# Patient Record
Sex: Female | Born: 1995
Health system: Southern US, Community
[De-identification: ages and names within clinical notes are randomized; demographics above are authoritative.]

## PROBLEM LIST (undated history)

## (undated) DIAGNOSIS — R519 Headache, unspecified: Secondary | ICD-10-CM

## (undated) DIAGNOSIS — R51 Headache: Secondary | ICD-10-CM

---
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
## 2017-08-02 DIAGNOSIS — Z30017 Encounter for initial prescription of implantable subdermal contraceptive: Secondary | ICD-10-CM | POA: Diagnosis not present

## 2017-09-12 ENCOUNTER — Encounter (HOSPITAL_COMMUNITY): Payer: Self-pay | Admitting: Emergency Medicine

## 2017-09-12 DIAGNOSIS — Z79899 Other long term (current) drug therapy: Secondary | ICD-10-CM | POA: Insufficient documentation

## 2017-09-12 DIAGNOSIS — R51 Headache: Secondary | ICD-10-CM | POA: Diagnosis present

## 2017-09-12 DIAGNOSIS — G43909 Migraine, unspecified, not intractable, without status migrainosus: Secondary | ICD-10-CM | POA: Insufficient documentation

## 2017-09-12 NOTE — ED Triage Notes (Signed)
Pt reports migraine x2 states, hx of same and they have been getting worse recently. States hasn't been able to sleep for two days d/t pain. Excedrin and ibuprofen not effective for pain.

## 2017-09-13 ENCOUNTER — Emergency Department (HOSPITAL_COMMUNITY)
Admission: EM | Admit: 2017-09-13 | Discharge: 2017-09-13 | Disposition: A | Discharge status: Home / Self Care | Payer: BLUE CROSS/BLUE SHIELD | Attending: Emergency Medicine | Admitting: Emergency Medicine

## 2017-09-13 DIAGNOSIS — G43909 Migraine, unspecified, not intractable, without status migrainosus: Secondary | ICD-10-CM

## 2017-09-13 HISTORY — DX: Headache: R51

## 2017-09-13 HISTORY — DX: Headache, unspecified: R51.9

## 2017-09-13 MED ORDER — KETOROLAC TROMETHAMINE 30 MG/ML IJ SOLN
30.0000 mg | Freq: Once | INTRAMUSCULAR | Status: AC
  Administered 2017-09-13: 30 mg via INTRAVENOUS
  Filled 2017-09-13: qty 1

## 2017-09-13 MED ORDER — METOCLOPRAMIDE HCL 5 MG/ML IJ SOLN
10.0000 mg | INTRAMUSCULAR | Status: AC
  Administered 2017-09-13: 10 mg via INTRAVENOUS
  Filled 2017-09-13: qty 2

## 2017-09-13 MED ORDER — SODIUM CHLORIDE 0.9 % IV BOLUS (SEPSIS)
1000.0000 mL | Freq: Once | INTRAVENOUS | Status: AC
  Administered 2017-09-13: 1000 mL via INTRAVENOUS

## 2017-09-13 NOTE — ED Notes (Signed)
PA at bedside.

## 2017-09-13 NOTE — ED Notes (Signed)
ED Provider at bedside. 

## 2017-09-13 NOTE — ED Notes (Signed)
Pt verbalizes understanding of d/c instructions. Pt ambulatory at d/c with all belongings.   

## 2017-09-13 NOTE — ED Provider Notes (Signed)
[facility]-EMERGENCY DEPT Provider Note   CSN: [id number] Arrival date & time: 09/12/17  2003     History   Chief Complaint Chief Complaint  Patient presents with  . Migraine    HPI Michelle Collins is a 21 y.o. female.  21 year old female presents to the emergency department for evaluation of a migraine headache which has been constant over the past day. She reports similar headaches intermittently over the past week. Headaches preceded by an aura characterized as spots in her vision. She notes anorexia, nausea, and photophobia with her migraine headaches. She has tried Excedrin Migraine and ibuprofen for pain without relief. She denies fever, head trauma, vomiting, extremity numbness or paresthesias, or extremity weakness. She has not seen a specialist for her headaches, but states that she has had similar migraines in the past.   The history is provided by the patient. No language interpreter was used.  Migraine     Past Medical History:  Diagnosis Date  . Frequent headaches     There are no active problems to display for this patient.   History reviewed. No pertinent surgical history.  OB History    No data available       Home Medications    Prior to Admission medications   Medication Sig Start Date End Date Taking? Authorizing Provider  aspirin-acetaminophen-caffeine (EXCEDRIN MIGRAINE) [phone] MG tablet Take 1 tablet by mouth every 6 (six) hours as needed for headache.   Yes [provider]  docusate sodium (COLACE) 100 MG capsule Take 1 capsule (100 mg total) by mouth every 12 (twelve) hours. Patient not taking: Reported on 09/13/2017 11/16/15   Lyndal Pulley, MD  polyethylene glycol powder (MIRALAX) powder TAKE 6 CAPFULS OF MIRALAX IN A 32 OUNCE GATORADE AND DRINK THE WHOLE BEVERAGE FOLLOWED BY 3 CAPFULS TWICE A DAY FOR THE NEXT WEEK AND FOLLOW UP WITH YOUR PRIMARY CARE PHYSICIAN. Patient not taking: Reported on 09/13/2017 11/16/15   Lyndal Pulley, MD     Family History Family History  Problem Relation Age of Onset  . Hypertension Other     Social History Social History  Substance Use Topics  . Smoking status: Never Smoker  . Smokeless tobacco: Never Used  . Alcohol use No     Allergies   Cinnamon and Coconut flavor   Review of Systems Review of Systems Ten systems reviewed and are negative for acute change, except as noted in the HPI.    Physical Exam Updated Vital Signs BP 123/87   Pulse 92   Temp 98.8 F (37.1 C) (Oral)   Resp 16   SpO2 100%   Physical Exam  Constitutional: She is oriented to person, place, and time. She appears well-developed and well-nourished. No distress.  Nontoxic and in NAD  HENT:  Head: Normocephalic and atraumatic.  Mouth/Throat: Oropharynx is clear and moist.  Symmetric rise of the uvula with phonation.  Eyes: Pupils are equal, round, and reactive to light. Conjunctivae and EOM are normal. No scleral icterus.  Neck: Normal range of motion.  No meningismus  Cardiovascular: Normal rate, regular rhythm and intact distal pulses.   Pulmonary/Chest: Effort normal. No respiratory distress. She has no wheezes.  Respirations even and unlabored  Musculoskeletal: Normal range of motion.  Neurological: She is alert and oriented to person, place, and time. No cranial nerve deficit. She exhibits normal muscle tone. Coordination normal.  GCS 15. Speech is goal oriented. No cranial nerve deficits appreciated; symmetric eyebrow raise, no facial drooping, tongue midline. Patient has  equal grip strength bilaterally with 5/5 strength against resistance in all major muscle groups bilaterally. Sensation to light touch intact. Patient moves extremities without ataxia. Patient ambulatory with steady gait.  Skin: Skin is warm and dry. No rash noted. She is not diaphoretic. No erythema. No pallor.  Psychiatric: She has a normal mood and affect. Her behavior is normal.  Nursing note and vitals  reviewed.    ED Treatments / Results  Labs (all labs ordered are listed, but only abnormal results are displayed) Labs Reviewed - No data to display  EKG  EKG Interpretation None       Radiology No results found.  Procedures Procedures (including critical care time)  Medications Ordered in ED Medications  sodium chloride 0.9 % bolus 1,000 mL (1,000 mLs Intravenous New Bag/Given 09/13/17 0234)  ketorolac (TORADOL) 30 MG/ML injection 30 mg (30 mg Intravenous Given 09/13/17 0238)  metoCLOPramide (REGLAN) injection 10 mg (10 mg Intravenous Given 09/13/17 0239)     Initial Impression / Assessment and Plan / ED Course  I have reviewed the triage vital signs and the nursing notes.  Pertinent labs & imaging results that were available during my care of the patient were reviewed by me and considered in my medical decision making (see chart for details).     21 year old female with history of migraine headaches presents to the emergency department for a headache consistent with prior migraines. She denies any recent head injury or trauma. She is afebrile and without nuchal rigidity or meningismus. Neurologic exam nonfocal, reassuring.  Patient managed in the emergency department with a migraine cocktail. She has had resolution of her headache following this regimen. Low suspicion for emergent intracranial process. Have advised outpatient primary care follow-up. Return precautions discussed and provided. Patient discharged in stable condition with no unaddressed concerns.   Final Clinical Impressions(s) / ED Diagnoses   Final diagnoses:  Migraine without status migrainosus, not intractable, unspecified migraine type    New Prescriptions New Prescriptions   No medications on file     Antony Madura, PA-C 09/13/17 1610    Ward, Layla Maw, DO 09/13/17 [phone]

## 2017-09-13 NOTE — Discharge Instructions (Signed)
Take Excedrin migraine as needed for persistent symptoms. Avoid chocolate and excessive caffeine. Get plenty of sleep. Follow up with a primary care doctor.

## 2017-09-13 NOTE — ED Notes (Signed)
Pt called out.  Stating she is concerned because she is starting to feel sleepy and she needs to drive.  Will inform PA.

## 2017-10-12 DIAGNOSIS — G43109 Migraine with aura, not intractable, without status migrainosus: Secondary | ICD-10-CM | POA: Diagnosis not present

## 2017-12-09 ENCOUNTER — Emergency Department (HOSPITAL_COMMUNITY)
Admission: EM | Admit: 2017-12-09 | Discharge: 2017-12-09 | Discharge status: Absent without leave | Payer: BLUE CROSS/BLUE SHIELD

## 2017-12-09 DIAGNOSIS — R1084 Generalized abdominal pain: Secondary | ICD-10-CM | POA: Diagnosis not present

## 2017-12-09 DIAGNOSIS — R319 Hematuria, unspecified: Secondary | ICD-10-CM | POA: Diagnosis not present

## 2017-12-09 DIAGNOSIS — R109 Unspecified abdominal pain: Secondary | ICD-10-CM | POA: Diagnosis not present

## 2017-12-09 DIAGNOSIS — R7989 Other specified abnormal findings of blood chemistry: Secondary | ICD-10-CM | POA: Diagnosis not present

## 2017-12-09 DIAGNOSIS — N390 Urinary tract infection, site not specified: Secondary | ICD-10-CM | POA: Diagnosis not present

## 2017-12-09 DIAGNOSIS — N281 Cyst of kidney, acquired: Secondary | ICD-10-CM | POA: Diagnosis not present

## 2017-12-09 DIAGNOSIS — R112 Nausea with vomiting, unspecified: Secondary | ICD-10-CM | POA: Diagnosis not present

## 2017-12-09 DIAGNOSIS — R945 Abnormal results of liver function studies: Secondary | ICD-10-CM | POA: Diagnosis not present

## 2017-12-12 DIAGNOSIS — R740 Nonspecific elevation of levels of transaminase and lactic acid dehydrogenase [LDH]: Secondary | ICD-10-CM | POA: Diagnosis not present

## 2017-12-12 DIAGNOSIS — N390 Urinary tract infection, site not specified: Secondary | ICD-10-CM | POA: Diagnosis not present

## 2017-12-12 DIAGNOSIS — R1032 Left lower quadrant pain: Secondary | ICD-10-CM | POA: Diagnosis not present

## 2017-12-12 DIAGNOSIS — N281 Cyst of kidney, acquired: Secondary | ICD-10-CM | POA: Diagnosis not present

## 2018-07-11 DIAGNOSIS — R300 Dysuria: Secondary | ICD-10-CM | POA: Diagnosis not present

## 2018-07-11 DIAGNOSIS — N3000 Acute cystitis without hematuria: Secondary | ICD-10-CM | POA: Diagnosis not present

## 2019-08-16 DIAGNOSIS — H52223 Regular astigmatism, bilateral: Secondary | ICD-10-CM | POA: Diagnosis not present

## 2019-08-23 ENCOUNTER — Other Ambulatory Visit: Payer: Self-pay

## 2019-08-23 ENCOUNTER — Ambulatory Visit (INDEPENDENT_AMBULATORY_CARE_PROVIDER_SITE_OTHER): Payer: BC Managed Care – PPO

## 2019-08-23 ENCOUNTER — Ambulatory Visit
Admission: EM | Admit: 2019-08-23 | Discharge: 2019-08-23 | Disposition: A | Discharge status: Home / Self Care | Payer: BC Managed Care – PPO | Attending: Emergency Medicine | Admitting: Emergency Medicine

## 2019-08-23 DIAGNOSIS — S92352A Displaced fracture of fifth metatarsal bone, left foot, initial encounter for closed fracture: Secondary | ICD-10-CM | POA: Diagnosis not present

## 2019-08-23 DIAGNOSIS — S92355A Nondisplaced fracture of fifth metatarsal bone, left foot, initial encounter for closed fracture: Secondary | ICD-10-CM | POA: Diagnosis not present

## 2019-08-23 MED ORDER — IBUPROFEN 800 MG PO TABS: 800.0000 mg | ORAL_TABLET | Freq: Three times a day (TID) | ORAL | 0 refills | Status: AC

## 2019-08-23 NOTE — ED Triage Notes (Signed)
Pt presents to UC stating she fell x2 days ago and twisted her left ankle. Pt states pain is mostly on the lateral and dorsal side of her left foot. Pt states she has taken ibuprofen. She is unable to bear weight on foot without severe pain. Pt came into room with crutches and boot on foot.

## 2019-08-23 NOTE — ED Provider Notes (Signed)
[facility]    CSN: [id number] Arrival date & time: 08/23/19  1825      History   Chief Complaint Chief Complaint  Patient presents with  . Foot Injury    left    HPI Michelle Collins is a 23 y.o. female presenting for continued left foot and ankle pain/swelling status post ankle inversion injury 2 days ago.  Patient arrives to clinic with crutches, Cam walker boot stating "this happens a lot ".  Patient has difficulty with dorsiflexion, has been nonweightbearing.  Tried taking ibuprofen with some pain alleviation.  Has been icing and elevating some.   Past Medical History:  Diagnosis Date  . Frequent headaches     There are no active problems to display for this patient.   History reviewed. No pertinent surgical history.  OB History   No obstetric history on file.      Home Medications    Prior to Admission medications   Medication Sig Start Date End Date Taking? Authorizing Provider  aspirin-acetaminophen-caffeine (EXCEDRIN MIGRAINE) [phone] MG tablet Take 1 tablet by mouth every 6 (six) hours as needed for headache.    [provider]  docusate sodium (COLACE) 100 MG capsule Take 1 capsule (100 mg total) by mouth every 12 (twelve) hours. Patient not taking: Reported on 09/13/2017 11/16/15   Lyndal PulleyKnott, Daniel, MD    Family History Family History  Problem Relation Age of Onset  . Hypertension Other   . Healthy Mother   . Healthy Father     Social History Social History   Tobacco Use  . Smoking status: Never Smoker  . Smokeless tobacco: Never Used  Substance Use Topics  . Alcohol use: No  . Drug use: No     Allergies   Cinnamon and Coconut flavor   Review of Systems Review of Systems  Constitutional: Negative for fatigue and fever.  Respiratory: Negative for cough and shortness of breath.   Cardiovascular: Negative for chest pain and palpitations.  Musculoskeletal:       Positive for left foot and ankle pain, swelling   Neurological: Negative for weakness and numbness.     Physical Exam Triage Vital Signs ED Triage Vitals  Enc Vitals Group     BP 08/23/19 1838 122/84     Pulse Rate 08/23/19 1838 88     Resp 08/23/19 1838 16     Temp 08/23/19 1838 98.9 F (37.2 C)     Temp Source 08/23/19 1838 Oral     SpO2 08/23/19 1838 98 %     Weight --      Height --      Head Circumference --      Peak Flow --      Pain Score 08/23/19 1843 8     Pain Loc --      Pain Edu? --      Excl. in GC? --    No data found.  Updated Vital Signs BP 122/84 (BP Location: Left Arm)   Pulse 88   Temp 98.9 F (37.2 C) (Oral)   Resp 16   LMP 08/16/2019   SpO2 98%   Visual Acuity Right Eye Distance:   Left Eye Distance:   Bilateral Distance:    Right Eye Near:   Left Eye Near:    Bilateral Near:     Physical Exam Constitutional:      General: She is not in acute distress. HENT:     Head: Normocephalic and atraumatic.  Eyes:     General: No scleral icterus.    Conjunctiva/sclera: Conjunctivae normal.     Pupils: Pupils are equal, round, and reactive to light.  Cardiovascular:     Rate and Rhythm: Normal rate.  Pulmonary:     Effort: Pulmonary effort is normal. No respiratory distress.  Musculoskeletal:     Comments: No obvious bony deformity of left foot.  Significant edema over dorsal lateral aspect of left foot.  No medial or lateral malleoli tenderness.  Significant tenderness to palpation over distal aspect of fifth metatarsal.  Patient has significantly decreased dorsiflexion, plantarflexion intact.  Passive ROM slightly decreased with dorsiflexion second to pain.    Skin:    General: Skin is warm.     Capillary Refill: Capillary refill takes less than 2 seconds.     Coloration: Skin is not jaundiced.     Findings: Bruising present. No rash.  Neurological:     General: No focal deficit present.     Mental Status: She is alert.      UC Treatments / Results  Labs (all labs ordered are  listed, but only abnormal results are displayed) Labs Reviewed - No data to display  EKG   Radiology Dg Foot Complete Left  Result Date: 08/23/2019 CLINICAL DATA:  Left foot pain and swelling status post in inversion injury while running 2 days ago. EXAM: LEFT FOOT - COMPLETE 3+ VIEW COMPARISON:  Left fifth toe radiographs 01/27/2015 FINDINGS: There is a nondisplaced transverse fracture through the base of the fifth metatarsal. Moderate soft tissue swelling is noted in the forefoot. There is no dislocation. IMPRESSION: Nondisplaced fifth metatarsal base fracture. Electronically Signed   By: Logan Bores M.D.   On: 08/23/2019 19:30    Procedures Procedures (including critical care time)  Medications Ordered in UC Medications - No data to display  Initial Impression / Assessment and Plan / UC Course  I have reviewed the triage vital signs and the nursing notes.  Pertinent labs & imaging results that were available during my care of the patient were reviewed by me and considered in my medical decision making (see chart for details).     1.  Close fracture of base of fifth metatarsal bone of left foot Left foot x-ray obtained in office, reviewed by me and radiology: Nondisplaced ends first fifth metatarsal base fracture.  Patient already has a cam walker boot, crutches.  Discussed that patient should be nonweightbearing, follow-up with Ortho in 1 week.  Contact information provided.  Return precautions discussed, patient verbalized understanding and is agreeable to plan. Final Clinical Impressions(s) / UC Diagnoses   Final diagnoses:  Closed fracture of base of fifth metatarsal bone of left foot, initial encounter     Discharge Instructions     May ice, rest, elevate the area(s) of pain.   May use OTC Tylenol, ibuprofen as needed for pain. Return if you develop worsening pain, chest pain, difficulty breathing.    ED Prescriptions    None     PDMP not reviewed this encounter.    Neldon Mc [place], [place] 08/23/19 1937

## 2019-08-23 NOTE — Discharge Instructions (Signed)
May ice, rest, elevate the area(s) of pain.   °May use OTC Tylenol, ibuprofen as needed for pain. °Return if you develop worsening pain, chest pain, difficulty breathing. °

## 2019-08-30 DIAGNOSIS — S92355A Nondisplaced fracture of fifth metatarsal bone, left foot, initial encounter for closed fracture: Secondary | ICD-10-CM | POA: Diagnosis not present

## 2019-09-22 ENCOUNTER — Ambulatory Visit (INDEPENDENT_AMBULATORY_CARE_PROVIDER_SITE_OTHER)
Admission: RE | Admit: 2019-09-22 | Discharge: 2019-09-22 | Disposition: A | Discharge status: Home / Self Care | Payer: BC Managed Care – PPO | Source: Ambulatory Visit

## 2019-09-22 DIAGNOSIS — N898 Other specified noninflammatory disorders of vagina: Secondary | ICD-10-CM | POA: Diagnosis not present

## 2019-09-22 MED ORDER — FLUCONAZOLE 150 MG PO TABS: ORAL_TABLET | ORAL | 0 refills | Status: DC

## 2019-09-22 NOTE — ED Provider Notes (Signed)
Virtual Visit via Video Note:  Michelle Collins  initiated request for Telemedicine visit with [facility] team. I connected with Michelle Collins  on 09/22/2019 at 2:58 PM  for a synchronized telemedicine visit using a video enabled HIPPA compliant telemedicine application. I verified that I am speaking with Michelle Collins  using two identifiers. Zigmund Gottron, NP  was physically located in a [facility] Urgent care site and HAIDEN CLUCAS was located at a different location.   The limitations of evaluation and management by telemedicine as well as the availability of in-person appointments were discussed. Patient was informed that she  may incur a bill ( including co-pay) for this virtual visit encounter. Fate D Maresh  expressed understanding and gave verbal consent to proceed with virtual visit.     History of Present Illness:Michelle Collins  is a 23 y.o. female presents with complaints of concern for yeast infection. States she typically can reach her PCP but they were not answering. Thick white vaginal discharge. Itches. Causes some external rash. Has had similar in the past with yeast infection. She is not diabetic. No vaginal bleeding. Denies STD concerns. No recent antibiotics.   Past Medical History:  Diagnosis Date  . Frequent headaches     Allergies  Allergen Reactions  . Cinnamon Hives  . Coconut Flavor Hives        Observations/Objective: Alert, oriented, non toxic in appearance. Clear coherent speech without difficulty. No increased work of breathing visualized.    Assessment and Plan: Patient with previous yeast infections with similar presentation, history consistent with yeast. Diflucan provided. Encouraged in person visit for eval if no improvement or if worsening. Patient verbalized understanding and agreeable to plan.    Follow Up Instructions:    I discussed the assessment and treatment plan with the patient. The patient was  provided an opportunity to ask questions and all were answered. The patient agreed with the plan and demonstrated an understanding of the instructions.   The patient was advised to call back or seek an in-person evaluation if the symptoms worsen or if the condition fails to improve as anticipated.  I provided 15 minutes of non-face-to-face time during this encounter.    Zigmund Gottron, NP  09/22/2019 2:58 PM         Zigmund Gottron, NP 09/22/19 1501

## 2019-09-22 NOTE — Discharge Instructions (Signed)
Take 1 pill today. If not complete resolution of symptoms may take second pill in three days. If improved no need to take.  If symptoms worsen or do not improve in the next week to be seen in person or to follow up with your PCP.

## 2019-09-27 DIAGNOSIS — S92355D Nondisplaced fracture of fifth metatarsal bone, left foot, subsequent encounter for fracture with routine healing: Secondary | ICD-10-CM | POA: Diagnosis not present

## 2020-01-09 ENCOUNTER — Encounter (HOSPITAL_COMMUNITY): Payer: Self-pay | Admitting: Emergency Medicine

## 2020-01-09 ENCOUNTER — Other Ambulatory Visit: Payer: Self-pay

## 2020-01-09 ENCOUNTER — Emergency Department (HOSPITAL_COMMUNITY)
Admission: EM | Admit: 2020-01-09 | Discharge: 2020-01-09 | Disposition: A | Discharge status: Home / Self Care | Payer: BC Managed Care – PPO | Attending: Emergency Medicine | Admitting: Emergency Medicine

## 2020-01-09 DIAGNOSIS — R112 Nausea with vomiting, unspecified: Secondary | ICD-10-CM | POA: Diagnosis not present

## 2020-01-09 DIAGNOSIS — Z791 Long term (current) use of non-steroidal anti-inflammatories (NSAID): Secondary | ICD-10-CM | POA: Insufficient documentation

## 2020-01-09 DIAGNOSIS — E876 Hypokalemia: Secondary | ICD-10-CM | POA: Diagnosis not present

## 2020-01-09 DIAGNOSIS — F1910 Other psychoactive substance abuse, uncomplicated: Secondary | ICD-10-CM | POA: Insufficient documentation

## 2020-01-09 LAB — I-STAT BETA HCG BLOOD, ED (MC, WL, AP ONLY): I-stat hCG, quantitative: <5 m[IU]/mL (ref <5)

## 2020-01-09 LAB — COMPREHENSIVE METABOLIC PANEL
ALT: 69 U/L — ABNORMAL HIGH (ref 0–44)
AST: 39 U/L (ref 15–41)
Albumin: 4.1 g/dL (ref 3.5–5.0)
Alkaline Phosphatase: 48 U/L (ref 38–126)
Anion gap: 12 (ref 5–15)
BUN: 12 mg/dL (ref 6–20)
CO2: 20 mmol/L — ABNORMAL LOW (ref 22–32)
Calcium: 8.9 mg/dL (ref 8.9–10.3)
Chloride: 104 mmol/L (ref 98–111)
Creatinine, Ser: 0.79 mg/dL (ref 0.44–1.00)
GFR calc Af Amer: >60 mL/min (ref >60)
GFR calc non Af Amer: >60 mL/min (ref >60)
Glucose, Bld: 177 mg/dL — ABNORMAL HIGH (ref 70–99)
Potassium: 2.6 mmol/L — CL (ref 3.5–5.1)
Sodium: 136 mmol/L (ref 135–145)
Total Bilirubin: 0.7 mg/dL (ref 0.3–1.2)
Total Protein: 7.2 g/dL (ref 6.5–8.1)

## 2020-01-09 LAB — CBC
HCT: 35.6 % — ABNORMAL LOW (ref 36.0–46.0)
Hemoglobin: 11.5 g/dL — ABNORMAL LOW (ref 12.0–15.0)
MCH: 29.4 pg (ref 26.0–34.0)
MCHC: 32.3 g/dL (ref 30.0–36.0)
MCV: 91 fL (ref 80.0–100.0)
Platelets: 622 10*3/uL — ABNORMAL HIGH (ref 150–400)
RBC: 3.91 MIL/uL (ref 3.87–5.11)
RDW: 12.4 % (ref 11.5–15.5)
WBC: 16.2 10*3/uL — ABNORMAL HIGH (ref 4.0–10.5)
nRBC: 0 % (ref 0.0–0.2)

## 2020-01-09 LAB — ETHANOL: Alcohol, Ethyl (B): <10 mg/dL (ref <10)

## 2020-01-09 MED ORDER — POTASSIUM CHLORIDE 10 MEQ/100ML IV SOLN
10.0000 meq | INTRAVENOUS | Status: AC
  Administered 2020-01-09 (×2): 10 meq via INTRAVENOUS
  Filled 2020-01-09 (×2): qty 100

## 2020-01-09 MED ORDER — SODIUM CHLORIDE 0.9 % IV BOLUS
1000.0000 mL | Freq: Once | INTRAVENOUS | Status: AC
  Administered 2020-01-09: 1000 mL via INTRAVENOUS

## 2020-01-09 MED ORDER — PROMETHAZINE HCL 25 MG/ML IJ SOLN
25.0000 mg | Freq: Once | INTRAMUSCULAR | Status: AC
  Administered 2020-01-09: 25 mg via INTRAVENOUS
  Filled 2020-01-09: qty 1

## 2020-01-09 MED ORDER — PROMETHAZINE HCL 25 MG PO TABS: 25.0000 mg | ORAL_TABLET | Freq: Four times a day (QID) | ORAL | 0 refills | Status: DC | PRN

## 2020-01-09 NOTE — ED Notes (Signed)
Pt discharge education provided. Pt is alert and oriented x 4 and ambulatory at discharge. Pt verbalizes understanding.

## 2020-01-09 NOTE — ED Provider Notes (Signed)
[facility] EMERGENCY DEPARTMENT Provider Note   CSN: [id number] Arrival date & time: 01/09/20  7106     History Chief Complaint  Patient presents with  . Alcohol Intoxication    Michelle Collins is a 24 y.o. female.  HPI Patient presents to the emergency department with nausea vomiting after drinking alcohol last night and taking ecstasy.  The patient used ecstasy and drink alcohol last night but then started vomiting this morning.  She was dropped off at the ER by friends.  The patient states that she does not have any pain but does not offer much information other than this. Past Medical History:  Diagnosis Date  . Frequent headaches     There are no problems to display for this patient.   History reviewed. No pertinent surgical history.   OB History   No obstetric history on file.     Family History  Problem Relation Age of Onset  . Hypertension Other   . Healthy Mother   . Healthy Father     Social History   Tobacco Use  . Smoking status: Never Smoker  . Smokeless tobacco: Never Used  Substance Use Topics  . Alcohol use: No  . Drug use: No    Home Medications Prior to Admission medications   Medication Sig Start Date End Date Taking? Authorizing Provider  aspirin-acetaminophen-caffeine (EXCEDRIN MIGRAINE) [phone] MG tablet Take 1 tablet by mouth every 6 (six) hours as needed for headache.    [provider]  docusate sodium (COLACE) 100 MG capsule Take 1 capsule (100 mg total) by mouth every 12 (twelve) hours. Patient not taking: Reported on 09/13/2017 11/16/15   Leo Grosser, MD  fluconazole (DIFLUCAN) 150 MG tablet Take 1 tablet today. If still with symptoms may repeat in 3 days. 09/22/19   Zigmund Gottron, NP  ibuprofen (ADVIL) 800 MG tablet Take 1 tablet (800 mg total) by mouth 3 (three) times daily. 08/23/19   Hall-Potvin, [place], PA-C    Allergies    Cinnamon and Coconut flavor  Review of Systems   Review of  Systems Level 5 caveat applies due to uncooperativeness Physical Exam Updated Vital Signs BP 106/66   Pulse 75   Temp 98.4 F (36.9 C) (Oral)   Resp 13   SpO2 100%   Physical Exam Vitals and nursing note reviewed.  Constitutional:      General: She is not in acute distress.    Appearance: She is well-developed.  HENT:     Head: Normocephalic and atraumatic.  Eyes:     Pupils: Pupils are equal, round, and reactive to light.  Cardiovascular:     Rate and Rhythm: Normal rate and regular rhythm.     Heart sounds: Normal heart sounds. No murmur. No friction rub. No gallop.   Pulmonary:     Effort: Pulmonary effort is normal. No respiratory distress.     Breath sounds: Normal breath sounds. No wheezing.  Abdominal:     General: Bowel sounds are normal. There is no distension.     Palpations: Abdomen is soft.     Tenderness: There is no abdominal tenderness.  Musculoskeletal:     Cervical back: Normal range of motion and neck supple.  Skin:    General: Skin is warm and dry.     Capillary Refill: Capillary refill takes less than 2 seconds.     Findings: No erythema or rash.  Neurological:     Mental Status: She is alert. She  is disoriented.     Motor: No abnormal muscle tone.     Coordination: Coordination normal.  Psychiatric:        Behavior: Behavior normal.     ED Results / Procedures / Treatments   Labs (all labs ordered are listed, but only abnormal results are displayed) Labs Reviewed  COMPREHENSIVE METABOLIC PANEL - Abnormal; Notable for the following components:      Result Value   Potassium 2.6 (*)    CO2 20 (*)    Glucose, Bld 177 (*)    ALT 69 (*)    All other components within normal limits  CBC - Abnormal; Notable for the following components:   WBC 16.2 (*)    Hemoglobin 11.5 (*)    HCT 35.6 (*)    Platelets 622 (*)    All other components within normal limits  ETHANOL  RAPID URINE DRUG SCREEN, HOSP PERFORMED  I-STAT BETA HCG BLOOD, ED ([facility], [facility],  [facility] ONLY)    EKG None  Radiology No results found.  Procedures Procedures (including critical care time)  Medications Ordered in ED Medications  sodium chloride 0.9 % bolus 1,000 mL (0 mLs Intravenous Stopped 01/09/20 1523)  promethazine (PHENERGAN) injection 25 mg (25 mg Intravenous Given 01/09/20 1121)  potassium chloride 10 mEq in 100 mL IVPB (0 mEq Intravenous Stopped 01/09/20 1459)    ED Course  I have reviewed the triage vital signs and the nursing notes.  Pertinent labs & imaging results that were available during my care of the patient were reviewed by me and considered in my medical decision making (see chart for details).    MDM Rules/Calculators/A&P                      Patient has been treated here in the ER and is improved following fluids and antiemetics.  The patient will be discharged home and told to follow-up with her primary doctor.  Her potassium was somewhat low therefore we replaced it with 2 rounds of potassium.  Patient is advised to return here for any worsening in her condition. Final Clinical Impression(s) / ED Diagnoses Final diagnoses:  None    Rx / DC Orders ED Discharge Orders    None       Charlestine Night, PA-C 01/09/20 1535    Pricilla Loveless, MD 01/10/20 [phone]

## 2020-01-09 NOTE — ED Notes (Signed)
PT sleeping soundly ,resp regular and equal. Pt does not open eyes when name is called.

## 2020-01-09 NOTE — ED Triage Notes (Addendum)
Pt arrives to ED from a party with complaints of ongoing alcohol intoxication after taking unknown amount of ecstasy last night. Patient states she does not know how much she drank. Patients friends dropped her off at the ED due to her being lethargic and constant nausea and vomiting.

## 2020-01-09 NOTE — Discharge Instructions (Addendum)
Return here as needed.  Increase your fluid intake.  Rest as much as possible. °

## 2020-01-09 NOTE — ED Notes (Signed)
PT will not speak to staff . Pt was asked to a

## 2020-03-16 ENCOUNTER — Encounter: Payer: Self-pay | Admitting: Emergency Medicine

## 2020-03-16 ENCOUNTER — Other Ambulatory Visit: Payer: Self-pay

## 2020-03-16 ENCOUNTER — Ambulatory Visit
Admission: EM | Admit: 2020-03-16 | Discharge: 2020-03-16 | Disposition: A | Discharge status: Home / Self Care | Payer: BC Managed Care – PPO | Attending: Emergency Medicine | Admitting: Emergency Medicine

## 2020-03-16 DIAGNOSIS — U071 COVID-19: Secondary | ICD-10-CM | POA: Insufficient documentation

## 2020-03-16 DIAGNOSIS — J029 Acute pharyngitis, unspecified: Secondary | ICD-10-CM | POA: Insufficient documentation

## 2020-03-16 LAB — POCT RAPID STREP A (OFFICE): Rapid Strep A Screen: NEGATIVE

## 2020-03-16 LAB — POC SARS CORONAVIRUS 2 AG -  ED: SARS Coronavirus 2 Ag: POSITIVE — AB

## 2020-03-16 MED ORDER — FLUTICASONE PROPIONATE 50 MCG/ACT NA SUSP: 1.0000 | Freq: Every day | NASAL | 0 refills | Status: AC

## 2020-03-16 MED ORDER — CETIRIZINE HCL 10 MG PO TABS: 10.0000 mg | ORAL_TABLET | Freq: Every day | ORAL | 0 refills | Status: AC

## 2020-03-16 MED ORDER — BENZONATATE 100 MG PO CAPS: 100.0000 mg | ORAL_CAPSULE | Freq: Three times a day (TID) | ORAL | 0 refills | Status: AC

## 2020-03-16 MED ORDER — PREDNISONE 10 MG PO TABS: 10.0000 mg | ORAL_TABLET | Freq: Every day | ORAL | 0 refills | Status: AC

## 2020-03-16 MED ORDER — ACETAMINOPHEN 325 MG PO TABS
650.0000 mg | ORAL_TABLET | Freq: Once | ORAL | Status: AC
  Administered 2020-03-16: 14:00:00 650 mg via ORAL

## 2020-03-16 NOTE — ED Provider Notes (Signed)
[facility]    CSN: [id number] Arrival date & time: 03/16/20  1349      History   Chief Complaint Chief Complaint  Patient presents with  . Sore Throat    HPI Michelle Collins is a 24 y.o. female presenting for 3-day course of sore throat, chills, headache.  Patient also endorsing dry cough, malaise.  Patient denies known sick contacts.  No hemoptysis, chest pain, difficulty breathing, nausea, vomiting, diarrhea, diarrhea or urinary symptoms.   Past Medical History:  Diagnosis Date  . Frequent headaches     There are no problems to display for this patient.   History reviewed. No pertinent surgical history.  OB History   No obstetric history on file.      Home Medications    Prior to Admission medications   Medication Sig Start Date End Date Taking? Authorizing Provider  aspirin-acetaminophen-caffeine (EXCEDRIN MIGRAINE) [phone] MG tablet Take 1 tablet by mouth every 6 (six) hours as needed for headache.    [provider]  benzonatate (TESSALON) 100 MG capsule Take 1 capsule (100 mg total) by mouth every 8 (eight) hours. 03/16/20   Hall-Potvin, [place], PA-C  cetirizine (ZYRTEC ALLERGY) 10 MG tablet Take 1 tablet (10 mg total) by mouth daily. 03/16/20   Hall-Potvin, [place], PA-C  fluticasone (FLONASE) 50 MCG/ACT nasal spray Place 1 spray into both nostrils daily. 03/16/20   Hall-Potvin, [place], PA-C  ibuprofen (ADVIL) 800 MG tablet Take 1 tablet (800 mg total) by mouth 3 (three) times daily. 08/23/19   Hall-Potvin, [place], PA-C  predniSONE (DELTASONE) 10 MG tablet Take 1 tablet (10 mg total) by mouth daily. 03/16/20   Hall-Potvin, [place], PA-C  promethazine (PHENERGAN) 25 MG tablet Take 1 tablet (25 mg total) by mouth every 6 (six) hours as needed for nausea or vomiting. 01/09/20 03/16/20  Charlestine Night, PA-C    Family History Family History  Problem Relation Age of Onset  . Hypertension Other   . Healthy Mother   . Healthy  Father     Social History Social History   Tobacco Use  . Smoking status: Never Smoker  . Smokeless tobacco: Never Used  Substance Use Topics  . Alcohol use: Yes  . Drug use: No     Allergies   Cinnamon and Coconut flavor   Review of Systems As per HPI   Physical Exam Triage Vital Signs ED Triage Vitals  Enc Vitals Group     BP      Pulse      Resp      Temp      Temp src      SpO2      Weight      Height      Head Circumference      Peak Flow      Pain Score      Pain Loc      Pain Edu?      Excl. in GC?    No data found.  Updated Vital Signs BP 130/81 (BP Location: Left Arm)   Pulse (!) 103   Temp (!) 100.6 F (38.1 C) (Oral) Comment: rechecked by provider  Resp 20   SpO2 97%   Visual Acuity Right Eye Distance:   Left Eye Distance:   Bilateral Distance:    Right Eye Near:   Left Eye Near:    Bilateral Near:     Physical Exam Constitutional:      General: She is not in acute distress.  Appearance: She is normal weight. She is not ill-appearing or diaphoretic.  HENT:     Head: Normocephalic and atraumatic.     Jaw: There is normal jaw occlusion. No tenderness or pain on movement.     Right Ear: Hearing, tympanic membrane, ear canal and external ear normal. No tenderness. No mastoid tenderness.     Left Ear: Hearing, tympanic membrane, ear canal and external ear normal. No tenderness. No mastoid tenderness.     Nose: No nasal deformity, septal deviation or nasal tenderness.     Right Turbinates: Not swollen or pale.     Left Turbinates: Not swollen or pale.     Right Sinus: No maxillary sinus tenderness or frontal sinus tenderness.     Left Sinus: No maxillary sinus tenderness or frontal sinus tenderness.     Mouth/Throat:     Lips: Pink. No lesions.     Mouth: Mucous membranes are moist. No injury.     Pharynx: Oropharynx is clear. Uvula midline. No posterior oropharyngeal erythema or uvula swelling.     Comments: no tonsillar exudate  or hypertrophy Cardiovascular:     Rate and Rhythm: Regular rhythm. Tachycardia present.     Heart sounds: No murmur. No gallop.   Pulmonary:     Effort: Pulmonary effort is normal. No respiratory distress.     Breath sounds: No wheezing or rales.  Musculoskeletal:     Cervical back: Normal range of motion and neck supple. No muscular tenderness.  Lymphadenopathy:     Cervical: No cervical adenopathy.  Skin:    General: Skin is warm.     Capillary Refill: Capillary refill takes less than 2 seconds.     Coloration: Skin is not pale.     Findings: No erythema.  Neurological:     General: No focal deficit present.     Mental Status: She is alert and oriented to person, place, and time.      UC Treatments / Results  Labs (all labs ordered are listed, but only abnormal results are displayed) Labs Reviewed  POC SARS CORONAVIRUS 2 AG -  ED - Abnormal; Notable for the following components:      Result Value   SARS Coronavirus 2 Ag Positive (*)    All other components within normal limits  POCT RAPID STREP A (OFFICE) - Normal  CULTURE, GROUP A STREP [facility])    EKG   Radiology No results found.  Procedures Procedures (including critical care time)  Medications Ordered in UC Medications  acetaminophen (TYLENOL) tablet 650 mg (650 mg Oral Given 03/16/20 1408)    Initial Impression / Assessment and Plan / UC Course  I have reviewed the triage vital signs and the nursing notes.  Pertinent labs & imaging results that were available during my care of the patient were reviewed by me and considered in my medical decision making (see chart for details).      Patient febrile, nontoxic and without airway compromise.  Patient given Tylenol for fever which she tolerated well.  Rapid strep negative, culture pending.  Rapid Covid positive: We will treat symptoms supportively as outlined below.  Return precautions discussed, patient verbalized understanding and is agreeable to  plan.. Final Clinical Impressions(s) / UC Diagnoses   Final diagnoses:  Sore throat  COVID-19 virus infection     Discharge Instructions     It is very important to remember that since you have tested positive for Covid you need to be staying home and quarantining to  help keep others safe and healthy in the community.  If you would like further evaluation for persistent or worsening symptoms, you may schedule an E-visit or virtual (video) visit throughout the [facility] app or website.  PLEASE NOTE: If you develop severe chest pain or shortness of breath please go to the ER or call 9-1-1 for further evaluation --> DO NOT schedule electronic or virtual visits for this. Please call our office for further guidance / recommendations as needed.  For information about the Covid vaccine, please visit [email or web address]    ED Prescriptions    Medication Sig Dispense Auth. Provider   benzonatate (TESSALON) 100 MG capsule Take 1 capsule (100 mg total) by mouth every 8 (eight) hours. 21 capsule Hall-Potvin, [place], PA-C   cetirizine (ZYRTEC ALLERGY) 10 MG tablet Take 1 tablet (10 mg total) by mouth daily. 30 tablet Hall-Potvin, [place], PA-C   predniSONE (DELTASONE) 10 MG tablet Take 1 tablet (10 mg total) by mouth daily. 5 tablet Hall-Potvin, [place], PA-C   fluticasone (FLONASE) 50 MCG/ACT nasal spray Place 1 spray into both nostrils daily. 16 g Hall-Potvin, [place], PA-C     PDMP not reviewed this encounter.   Hall-Potvin, [place], [place] 03/16/20 1636

## 2020-03-16 NOTE — Discharge Instructions (Addendum)
It is very important to remember that since you have tested positive for Covid you need to be staying home and quarantining to help keep others safe and healthy in the community.  If you would like further evaluation for persistent or worsening symptoms, you may schedule an E-visit or virtual (video) visit throughout the [place] [facility] app or website.  PLEASE NOTE: If you develop severe chest pain or shortness of breath please go to the ER or call 9-1-1 for further evaluation --> DO NOT schedule electronic or virtual visits for this. Please call our office for further guidance / recommendations as needed.  For information about the Covid vaccine, please visit [email or web address] 

## 2020-03-16 NOTE — ED Triage Notes (Signed)
Sore throat, chills and pain in head when she looks upward-all started friday

## 2020-03-19 LAB — CULTURE, GROUP A STREP (THRC)

## 2020-04-16 ENCOUNTER — Ambulatory Visit
Admission: RE | Admit: 2020-04-16 | Discharge: 2020-04-16 | Disposition: A | Discharge status: Home / Self Care | Payer: BC Managed Care – PPO | Source: Ambulatory Visit | Attending: Family Medicine | Admitting: Family Medicine

## 2020-04-16 ENCOUNTER — Other Ambulatory Visit: Payer: Self-pay

## 2020-04-16 ENCOUNTER — Other Ambulatory Visit: Payer: Self-pay | Admitting: Family Medicine

## 2020-04-16 DIAGNOSIS — Z1159 Encounter for screening for other viral diseases: Secondary | ICD-10-CM | POA: Diagnosis not present

## 2020-04-16 DIAGNOSIS — Z01818 Encounter for other preprocedural examination: Secondary | ICD-10-CM

## 2020-04-16 DIAGNOSIS — Z419 Encounter for procedure for purposes other than remedying health state, unspecified: Secondary | ICD-10-CM | POA: Diagnosis not present

## 2020-04-16 DIAGNOSIS — Z03818 Encounter for observation for suspected exposure to other biological agents ruled out: Secondary | ICD-10-CM | POA: Diagnosis not present

## 2020-04-16 DIAGNOSIS — E6609 Other obesity due to excess calories: Secondary | ICD-10-CM | POA: Diagnosis not present

## 2020-06-18 DIAGNOSIS — Z791 Long term (current) use of non-steroidal anti-inflammatories (NSAID): Secondary | ICD-10-CM | POA: Diagnosis not present

## 2020-06-18 DIAGNOSIS — R220 Localized swelling, mass and lump, head: Secondary | ICD-10-CM | POA: Diagnosis not present

## 2020-06-18 DIAGNOSIS — Z91018 Allergy to other foods: Secondary | ICD-10-CM | POA: Diagnosis not present

## 2020-06-18 DIAGNOSIS — L299 Pruritus, unspecified: Secondary | ICD-10-CM | POA: Diagnosis not present

## 2020-06-26 ENCOUNTER — Encounter (HOSPITAL_BASED_OUTPATIENT_CLINIC_OR_DEPARTMENT_OTHER): Payer: Self-pay | Admitting: *Deleted

## 2020-06-26 ENCOUNTER — Emergency Department (HOSPITAL_BASED_OUTPATIENT_CLINIC_OR_DEPARTMENT_OTHER)
Admission: EM | Admit: 2020-06-26 | Discharge: 2020-06-26 | Disposition: A | Discharge status: Home / Self Care | Payer: BC Managed Care – PPO | Attending: Emergency Medicine | Admitting: Emergency Medicine

## 2020-06-26 ENCOUNTER — Other Ambulatory Visit: Payer: Self-pay

## 2020-06-26 DIAGNOSIS — Z5321 Procedure and treatment not carried out due to patient leaving prior to being seen by health care provider: Secondary | ICD-10-CM | POA: Insufficient documentation

## 2020-06-26 DIAGNOSIS — H9201 Otalgia, right ear: Secondary | ICD-10-CM | POA: Diagnosis not present

## 2020-06-26 NOTE — ED Triage Notes (Signed)
Right ear pain and sore throat today.

## 2020-06-26 NOTE — ED Notes (Signed)
Pt called 2x for room 13. No answer in lobby

## 2020-06-27 DIAGNOSIS — R05 Cough: Secondary | ICD-10-CM | POA: Diagnosis not present

## 2020-06-27 DIAGNOSIS — H6692 Otitis media, unspecified, left ear: Secondary | ICD-10-CM | POA: Diagnosis not present

## 2020-06-27 DIAGNOSIS — H1032 Unspecified acute conjunctivitis, left eye: Secondary | ICD-10-CM | POA: Diagnosis not present

## 2020-08-17 DIAGNOSIS — W01110A Fall on same level from slipping, tripping and stumbling with subsequent striking against sharp glass, initial encounter: Secondary | ICD-10-CM | POA: Diagnosis not present

## 2020-08-17 DIAGNOSIS — S99922A Unspecified injury of left foot, initial encounter: Secondary | ICD-10-CM | POA: Diagnosis not present

## 2020-08-17 DIAGNOSIS — S91311A Laceration without foreign body, right foot, initial encounter: Secondary | ICD-10-CM | POA: Diagnosis not present

## 2020-08-17 DIAGNOSIS — G8911 Acute pain due to trauma: Secondary | ICD-10-CM | POA: Diagnosis not present

## 2021-10-19 ENCOUNTER — Other Ambulatory Visit: Payer: Self-pay

## 2021-10-19 ENCOUNTER — Emergency Department (HOSPITAL_BASED_OUTPATIENT_CLINIC_OR_DEPARTMENT_OTHER)
Admission: EM | Admit: 2021-10-19 | Discharge: 2021-10-19 | Disposition: A | Discharge status: Home / Self Care | Payer: BC Managed Care – PPO | Attending: Emergency Medicine | Admitting: Emergency Medicine

## 2021-10-19 ENCOUNTER — Encounter (HOSPITAL_BASED_OUTPATIENT_CLINIC_OR_DEPARTMENT_OTHER): Payer: Self-pay | Admitting: Obstetrics and Gynecology

## 2021-10-19 ENCOUNTER — Emergency Department (HOSPITAL_BASED_OUTPATIENT_CLINIC_OR_DEPARTMENT_OTHER): Payer: BC Managed Care – PPO

## 2021-10-19 DIAGNOSIS — M79602 Pain in left arm: Secondary | ICD-10-CM | POA: Diagnosis not present

## 2021-10-19 DIAGNOSIS — M79642 Pain in left hand: Secondary | ICD-10-CM | POA: Diagnosis present

## 2021-10-19 LAB — COMPREHENSIVE METABOLIC PANEL
ALT: 118 U/L — ABNORMAL HIGH (ref 0–44)
AST: 38 U/L (ref 15–41)
Albumin: 4.2 g/dL (ref 3.5–5.0)
Alkaline Phosphatase: 147 U/L — ABNORMAL HIGH (ref 38–126)
Anion gap: 9 (ref 5–15)
BUN: 12 mg/dL (ref 6–20)
CO2: 25 mmol/L (ref 22–32)
Calcium: 9.5 mg/dL (ref 8.9–10.3)
Chloride: 105 mmol/L (ref 98–111)
Creatinine, Ser: 0.71 mg/dL (ref 0.44–1.00)
GFR, Estimated: >60 mL/min (ref >60)
Glucose, Bld: 71 mg/dL (ref 70–99)
Potassium: 3.6 mmol/L (ref 3.5–5.1)
Sodium: 139 mmol/L (ref 135–145)
Total Bilirubin: 0.3 mg/dL (ref 0.3–1.2)
Total Protein: 7.6 g/dL (ref 6.5–8.1)

## 2021-10-19 LAB — CBC WITH DIFFERENTIAL/PLATELET
Abs Immature Granulocytes: 0.01 10*3/uL (ref 0.00–0.07)
Basophils Absolute: 0.1 10*3/uL (ref 0.0–0.1)
Basophils Relative: 1 %
Eosinophils Absolute: 0.1 10*3/uL (ref 0.0–0.5)
Eosinophils Relative: 1 %
HCT: 32.7 % — ABNORMAL LOW (ref 36.0–46.0)
Hemoglobin: 10.7 g/dL — ABNORMAL LOW (ref 12.0–15.0)
Immature Granulocytes: 0 %
Lymphocytes Relative: 35 %
Lymphs Abs: 2.3 10*3/uL (ref 0.7–4.0)
MCH: 28.5 pg (ref 26.0–34.0)
MCHC: 32.7 g/dL (ref 30.0–36.0)
MCV: 87.2 fL (ref 80.0–100.0)
Monocytes Absolute: 0.6 10*3/uL (ref 0.1–1.0)
Monocytes Relative: 9 %
Neutro Abs: 3.6 10*3/uL (ref 1.7–7.7)
Neutrophils Relative %: 54 %
Platelets: 553 10*3/uL — ABNORMAL HIGH (ref 150–400)
RBC: 3.75 MIL/uL — ABNORMAL LOW (ref 3.87–5.11)
RDW: 12.6 % (ref 11.5–15.5)
WBC: 6.7 10*3/uL (ref 4.0–10.5)
nRBC: 0 % (ref 0.0–0.2)

## 2021-10-19 LAB — D-DIMER, QUANTITATIVE: D-Dimer, Quant: 2.24 ug/mL-FEU — ABNORMAL HIGH (ref 0.00–0.50)

## 2021-10-19 MED ORDER — OXYCODONE HCL 5 MG PO TABS
5.0000 mg | ORAL_TABLET | Freq: Once | ORAL | Status: AC
  Administered 2021-10-19: 5 mg via ORAL
  Filled 2021-10-19: qty 1

## 2021-10-19 MED ORDER — IBUPROFEN 800 MG PO TABS
800.0000 mg | ORAL_TABLET | Freq: Once | ORAL | Status: AC
  Administered 2021-10-19: 800 mg via ORAL
  Filled 2021-10-19: qty 1

## 2021-10-19 MED ORDER — ACETAMINOPHEN 500 MG PO TABS
1000.0000 mg | ORAL_TABLET | Freq: Once | ORAL | Status: AC
  Administered 2021-10-19: 1000 mg via ORAL
  Filled 2021-10-19: qty 2

## 2021-10-19 NOTE — ED Provider Notes (Signed)
[facility] EMERGENCY DEPT Provider Note   CSN: [id number] Arrival date & time: 10/19/21  1120     History Chief Complaint  Patient presents with   Hand Pain    Michelle Collins is a 25 y.o. female.  25 yo F with a chief complaints of discoloration to her fingers bilaterally.  She had a humerus fracture and had it surgically repaired about 10 days ago.  She has follow-up scheduled for next week.  Has had some pain to the area off and on.  No chest pain no trouble breathing no recurrent trauma.  The history is provided by the patient and a parent.  Hand Pain Pertinent negatives include no chest pain, no headaches and no shortness of breath.  Illness Severity:  Moderate Onset quality:  Gradual Duration:  10 days Timing:  Constant Progression:  Worsening Chronicity:  New Associated symptoms: myalgias   Associated symptoms: no chest pain, no congestion, no fever, no headaches, no nausea, no rhinorrhea, no shortness of breath, no vomiting and no wheezing       Past Medical History:  Diagnosis Date   Frequent headaches     There are no problems to display for this patient.   History reviewed. No pertinent surgical history.   OB History   No obstetric history on file.     Family History  Problem Relation Age of Onset   Hypertension Other    Healthy Mother    Healthy Father     Social History   Tobacco Use   Smoking status: Never    Passive exposure: Never   Smokeless tobacco: Never  Vaping Use   Vaping Use: Never used  Substance Use Topics   Alcohol use: Yes   Drug use: No    Home Medications Prior to Admission medications   Medication Sig Start Date End Date Taking? Authorizing Provider  aspirin-acetaminophen-caffeine (EXCEDRIN MIGRAINE) [phone] MG tablet Take 1 tablet by mouth every 6 (six) hours as needed for headache.    [provider]  benzonatate (TESSALON) 100 MG capsule Take 1 capsule (100 mg total) by mouth every  8 (eight) hours. 03/16/20   Hall-Potvin, [place], PA-C  cetirizine (ZYRTEC ALLERGY) 10 MG tablet Take 1 tablet (10 mg total) by mouth daily. 03/16/20   Hall-Potvin, [place], PA-C  fluticasone (FLONASE) 50 MCG/ACT nasal spray Place 1 spray into both nostrils daily. 03/16/20   Hall-Potvin, [place], PA-C  ibuprofen (ADVIL) 800 MG tablet Take 1 tablet (800 mg total) by mouth 3 (three) times daily. 08/23/19   Hall-Potvin, [place], PA-C  predniSONE (DELTASONE) 10 MG tablet Take 1 tablet (10 mg total) by mouth daily. 03/16/20   Hall-Potvin, [place], PA-C  promethazine (PHENERGAN) 25 MG tablet Take 1 tablet (25 mg total) by mouth every 6 (six) hours as needed for nausea or vomiting. 01/09/20 03/16/20  Lawyer, Cristal Deer, PA-C    Allergies    Cinnamon and Coconut flavor  Review of Systems   Review of Systems  Constitutional:  Negative for chills and fever.  HENT:  Negative for congestion and rhinorrhea.   Eyes:  Negative for redness and visual disturbance.  Respiratory:  Negative for shortness of breath and wheezing.   Cardiovascular:  Negative for chest pain and palpitations.  Gastrointestinal:  Negative for nausea and vomiting.  Genitourinary:  Negative for dysuria and urgency.  Musculoskeletal:  Positive for arthralgias and myalgias.  Skin:  Negative for pallor and wound.  Neurological:  Negative for dizziness and headaches.   Physical Exam  Updated Vital Signs BP 120/76 (BP Location: Right Arm)   Pulse 66   Temp 98.6 F (37 C)   Resp 16   Ht 5\' 6"  (1.676 m)   Wt 81.6 kg   SpO2 100%   BMI 29.05 kg/m   Physical Exam Vitals and nursing note reviewed.  Constitutional:      General: She is not in acute distress.    Appearance: She is well-developed. She is not diaphoretic.  HENT:     Head: Normocephalic and atraumatic.  Eyes:     Pupils: Pupils are equal, round, and reactive to light.  Cardiovascular:     Rate and Rhythm: Normal rate and regular rhythm.     Heart sounds: No murmur  heard.   No friction rub. No gallop.  Pulmonary:     Effort: Pulmonary effort is normal.     Breath sounds: No wheezing or rales.  Abdominal:     General: There is no distension.     Palpations: Abdomen is soft.     Tenderness: There is no abdominal tenderness.  Musculoskeletal:        General: Swelling present. No tenderness.     Cervical back: Normal range of motion and neck supple.     Comments: Left upper extremity with some mild swelling about the upper arm.  She has a posterior incision to the upper arm that appears clean dry and intact.  No fluctuance no induration no drainage.  Pulse motor and sensation intact distally.  Patient does have some discoloration to her fingernails on both hands.  Cap refills less than 2 seconds.  Discoloration and appears to be consistent with where she has some nail adhesive.  Skin:    General: Skin is warm and dry.  Neurological:     Mental Status: She is alert and oriented to person, place, and time.  Psychiatric:        Behavior: Behavior normal.    ED Results / Procedures / Treatments   Labs (all labs ordered are listed, but only abnormal results are displayed) Labs Reviewed  CBC WITH DIFFERENTIAL/PLATELET - Abnormal; Notable for the following components:      Result Value   RBC 3.75 (*)    Hemoglobin 10.7 (*)    HCT 32.7 (*)    Platelets 553 (*)    All other components within normal limits  COMPREHENSIVE METABOLIC PANEL - Abnormal; Notable for the following components:   ALT 118 (*)    Alkaline Phosphatase 147 (*)    All other components within normal limits  D-DIMER, QUANTITATIVE - Abnormal; Notable for the following components:   D-Dimer, Quant 2.24 (*)    All other components within normal limits    EKG None  Radiology No results found.  Procedures Procedures   Medications Ordered in ED Medications  acetaminophen (TYLENOL) tablet 1,000 mg (1,000 mg Oral Given 10/19/21 1359)  oxyCODONE (Oxy IR/ROXICODONE) immediate  release tablet 5 mg (5 mg Oral Given 10/19/21 1400)  ibuprofen (ADVIL) tablet 800 mg (800 mg Oral Given 10/19/21 1359)    ED Course  I have reviewed the triage vital signs and the nursing notes.  Pertinent labs & imaging results that were available during my care of the patient were reviewed by me and considered in my medical decision making (see chart for details).    MDM Rules/Calculators/A&P  25 yo F with a chief complaints of discoloration to her fingers.  I suspect this is most likely to be the adhesive used for fake nail attachment.  She does not appear tachypneic has clear lung sounds.  She had a D-dimer that was ordered in the triage process will obtain an ultrasound of the left upper extremity as it was positive.  DVT study is negative.  Will discharge home.  Orthopedic surgery follow-up.  3:28 PM:  I have discussed the diagnosis/risks/treatment options with the patient and family and believe the pt to be eligible for discharge home to follow-up with Ortho. We also discussed returning to the ED immediately if new or worsening sx occur. We discussed the sx which are most concerning (e.g., sudden worsening pain, fever, inability to tolerate by mouth) that necessitate immediate return. Medications administered to the patient during their visit and any new prescriptions provided to the patient are listed below.  Medications given during this visit Medications  acetaminophen (TYLENOL) tablet 1,000 mg (1,000 mg Oral Given 10/19/21 1359)  oxyCODONE (Oxy IR/ROXICODONE) immediate release tablet 5 mg (5 mg Oral Given 10/19/21 1400)  ibuprofen (ADVIL) tablet 800 mg (800 mg Oral Given 10/19/21 1359)     The patient appears reasonably screen and/or stabilized for discharge and I doubt any other medical condition or other [facility] requiring further screening, evaluation, or treatment in the ED at this time prior to discharge.   Final Clinical Impression(s) / ED  Diagnoses Final diagnoses:  Left arm pain    Rx / DC Orders ED Discharge Orders     None        Deno Etienne, DO 10/19/21 1528

## 2021-10-19 NOTE — Discharge Instructions (Signed)
Please follow-up with your orthopedic surgeon in the office.  Please return for worsening pain to the arm fever or drainage.

## 2021-10-19 NOTE — ED Triage Notes (Signed)
Patient reports to the ER for finger discoloration bilaterally. Patient reports she had surgery on her right arm on November 4th. Patient reports her fingers are discolored.

## 2021-11-26 ENCOUNTER — Encounter: Payer: Self-pay | Admitting: Obstetrics and Gynecology

## 2021-11-26 ENCOUNTER — Ambulatory Visit (INDEPENDENT_AMBULATORY_CARE_PROVIDER_SITE_OTHER): Payer: BC Managed Care – PPO | Admitting: Obstetrics and Gynecology

## 2021-11-26 ENCOUNTER — Other Ambulatory Visit: Payer: Self-pay

## 2021-11-26 VITALS — BP 120/70 | HR 73 | Temp 98.2°F | Ht 66.0 in | Wt 195.8 lb

## 2021-11-26 DIAGNOSIS — Z3009 Encounter for other general counseling and advice on contraception: Secondary | ICD-10-CM

## 2021-11-26 NOTE — Progress Notes (Signed)
Patient has had recent surgery on her LT arm. She is concerned that the Nexplanon is embedded in her LT arm (where her surgery was done). She states her mother advised her to wait to have the Nexplanon removed until after she has had her next x-ray on her LT arm.  Patient rescheduled Nexplanon removal and IUD insertion.   Raelyn Mora, CNM  11/26/2021 4:31 PM

## 2021-12-02 ENCOUNTER — Encounter: Payer: Self-pay | Admitting: Obstetrics and Gynecology

## 2021-12-10 ENCOUNTER — Ambulatory Visit: Payer: BC Managed Care – PPO | Admitting: Obstetrics and Gynecology

## 2022-12-30 ENCOUNTER — Telehealth: Payer: Self-pay

## 2022-12-30 NOTE — Telephone Encounter (Signed)
Mychart msg sent. AS, CMA 

## 2023-09-14 ENCOUNTER — Ambulatory Visit: Payer: Medicaid Other | Admitting: Family Medicine

## 2024-04-03 DIAGNOSIS — R1011 Right upper quadrant pain: Secondary | ICD-10-CM | POA: Diagnosis not present

## 2024-04-03 DIAGNOSIS — N200 Calculus of kidney: Secondary | ICD-10-CM | POA: Diagnosis not present

## 2024-04-03 DIAGNOSIS — R10811 Right upper quadrant abdominal tenderness: Secondary | ICD-10-CM | POA: Diagnosis not present

## 2024-04-03 DIAGNOSIS — R197 Diarrhea, unspecified: Secondary | ICD-10-CM | POA: Diagnosis not present

## 2024-04-03 DIAGNOSIS — R110 Nausea: Secondary | ICD-10-CM | POA: Diagnosis not present

## 2024-04-03 DIAGNOSIS — K3189 Other diseases of stomach and duodenum: Secondary | ICD-10-CM | POA: Diagnosis not present

## 2024-04-03 DIAGNOSIS — R1013 Epigastric pain: Secondary | ICD-10-CM | POA: Diagnosis not present

## 2024-04-03 DIAGNOSIS — N281 Cyst of kidney, acquired: Secondary | ICD-10-CM | POA: Diagnosis not present
